# Patient Record
Sex: Male | Born: 1990 | Race: White | Hispanic: No | Marital: Single
Health system: Southern US, Community
[De-identification: ages and names within clinical notes are randomized; demographics above are authoritative.]

---
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
## 2017-06-15 ENCOUNTER — Emergency Department (HOSPITAL_COMMUNITY): Payer: Self-pay

## 2017-06-15 ENCOUNTER — Encounter (HOSPITAL_COMMUNITY): Payer: Self-pay

## 2017-06-15 ENCOUNTER — Emergency Department (HOSPITAL_COMMUNITY)
Admission: EM | Admit: 2017-06-15 | Discharge: 2017-06-15 | Disposition: A | Discharge status: Home / Self Care | Payer: Self-pay | Attending: Physician Assistant | Admitting: Physician Assistant

## 2017-06-15 DIAGNOSIS — Y9289 Other specified places as the place of occurrence of the external cause: Secondary | ICD-10-CM | POA: Insufficient documentation

## 2017-06-15 DIAGNOSIS — Y999 Unspecified external cause status: Secondary | ICD-10-CM | POA: Insufficient documentation

## 2017-06-15 DIAGNOSIS — Y939 Activity, unspecified: Secondary | ICD-10-CM | POA: Insufficient documentation

## 2017-06-15 DIAGNOSIS — S0990XA Unspecified injury of head, initial encounter: Secondary | ICD-10-CM

## 2017-06-15 DIAGNOSIS — S098XXA Other specified injuries of head, initial encounter: Secondary | ICD-10-CM | POA: Insufficient documentation

## 2017-06-15 DIAGNOSIS — S139XXA Sprain of joints and ligaments of unspecified parts of neck, initial encounter: Secondary | ICD-10-CM | POA: Insufficient documentation

## 2017-06-15 MED ORDER — ONDANSETRON 4 MG PO TBDP
8.0000 mg | ORAL_TABLET | Freq: Once | ORAL | Status: AC
  Administered 2017-06-15: 8 mg via ORAL
  Filled 2017-06-15: qty 2

## 2017-06-15 MED ORDER — IBUPROFEN 400 MG PO TABS
600.0000 mg | ORAL_TABLET | Freq: Once | ORAL | Status: DC
  Filled 2017-06-15: qty 1

## 2017-06-15 NOTE — ED Notes (Signed)
Pt unable to sign due to pt irritable and repeatedly stating "i just want to go home, yall havent done anything for me"

## 2017-06-15 NOTE — ED Notes (Signed)
c collar removed by pt, RN encouraged pt to keep it on but he insisted that he not wear it
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
## 2017-06-15 NOTE — ED Notes (Signed)
Patient transported to CT 

## 2017-06-15 NOTE — Discharge Instructions (Signed)
Ibuprofen or Tylenol for pain. Apply ice pack to the back of the head several times a day. Avoid any strenuous activity. Follow-up with family doctor as needed.
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
## 2020-10-26 ENCOUNTER — Other Ambulatory Visit: Payer: Self-pay

## 2020-10-26 ENCOUNTER — Other Ambulatory Visit: Payer: Self-pay | Admitting: Critical Care Medicine

## 2020-10-26 DIAGNOSIS — Z20822 Contact with and (suspected) exposure to covid-19: Secondary | ICD-10-CM

## 2020-10-27 LAB — NOVEL CORONAVIRUS, NAA: SARS-CoV-2, NAA: DETECTED — AB

## 2020-10-27 LAB — SPECIMEN STATUS REPORT

## 2020-10-27 LAB — SARS-COV-2, NAA 2 DAY TAT
